# Patient Record
Sex: Male | Born: 1975 | Race: White | Hispanic: No | Marital: Single
Health system: Southern US, Community
[De-identification: ages and names within clinical notes are randomized; demographics above are authoritative.]

---

## 1999-09-28 ENCOUNTER — Emergency Department (HOSPITAL_COMMUNITY): Admission: EM | Admit: 1999-09-28 | Discharge: 1999-09-28 | Payer: Self-pay | Admitting: Emergency Medicine

## 1999-09-28 ENCOUNTER — Encounter: Payer: Self-pay | Admitting: Emergency Medicine

## 2015-11-30 ENCOUNTER — Other Ambulatory Visit: Payer: Self-pay | Admitting: Family

## 2015-11-30 DIAGNOSIS — R945 Abnormal results of liver function studies: Secondary | ICD-10-CM

## 2015-12-14 ENCOUNTER — Ambulatory Visit
Admission: RE | Admit: 2015-12-14 | Discharge: 2015-12-14 | Disposition: A | Discharge status: Home / Self Care | Payer: Commercial Managed Care - HMO | Source: Ambulatory Visit | Attending: Family | Admitting: Family

## 2015-12-14 DIAGNOSIS — R945 Abnormal results of liver function studies: Secondary | ICD-10-CM

## 2017-07-30 IMAGING — US US ABDOMEN LIMITED
1 series · 14 of 25 positions shown · non-contrast
Comparison: None.

CLINICAL DATA: Abnormal liver function test.

EXAM:
US ABDOMEN LIMITED - RIGHT UPPER QUADRANT

[Series 1: us abdomen limited · 0.24mm/px · 14 of 39 slices shown]
[im 1/39]
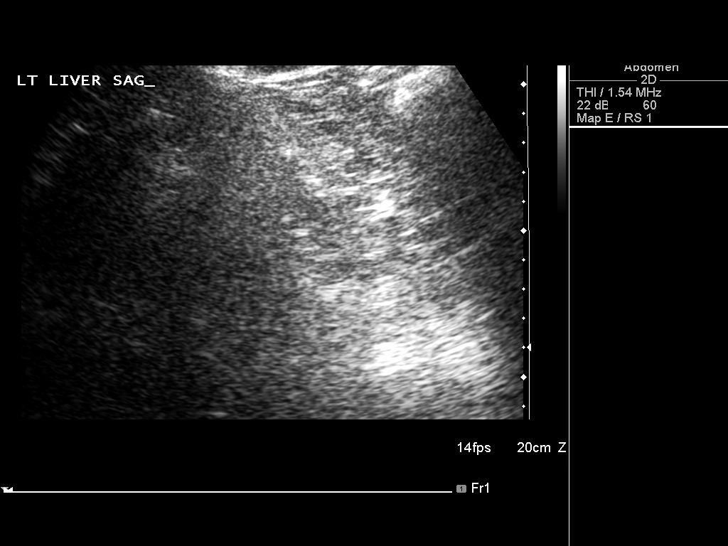
[im 4/39]
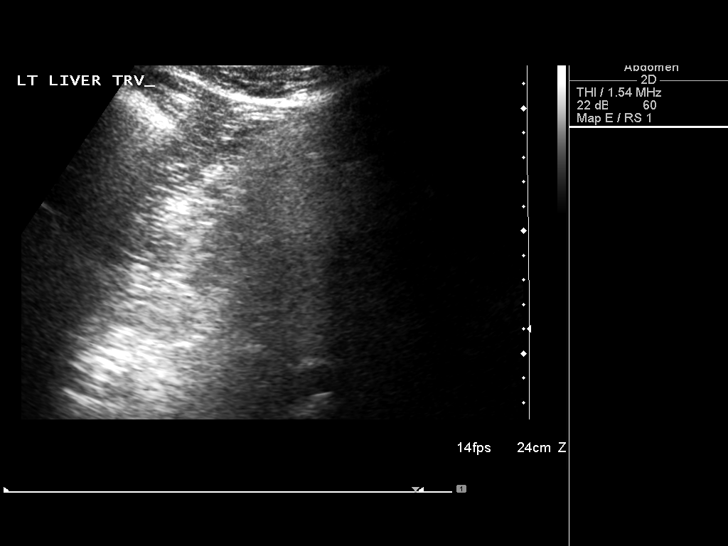
[im 7/39]
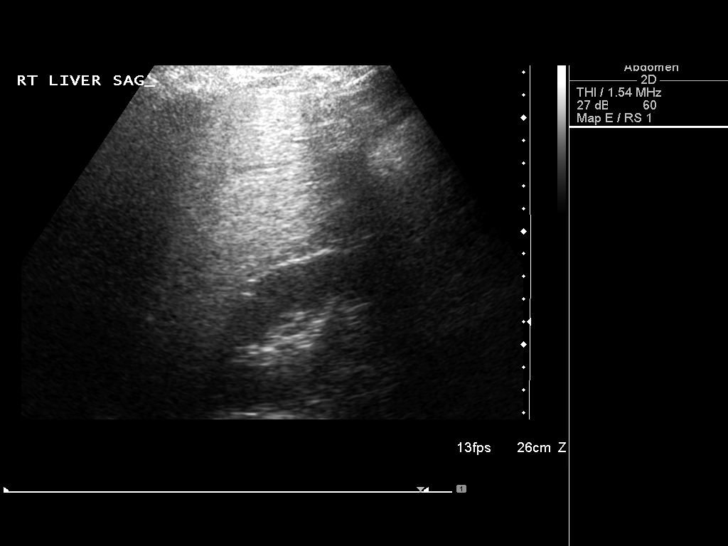
[im 10/39]
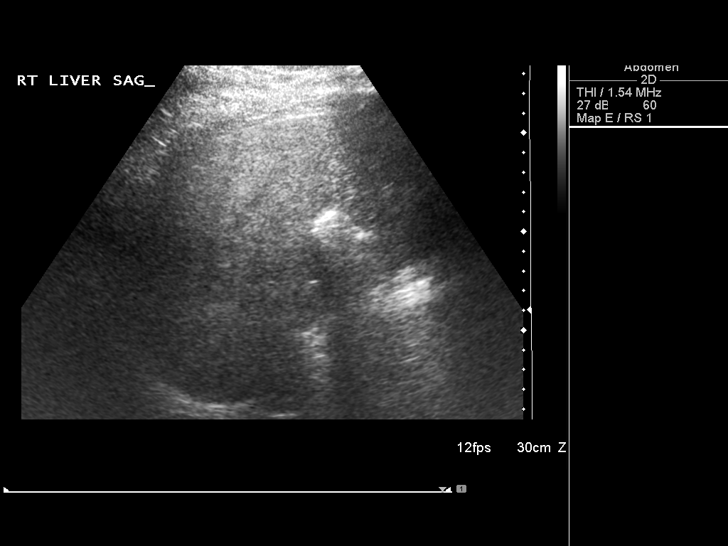
[im 13/39]
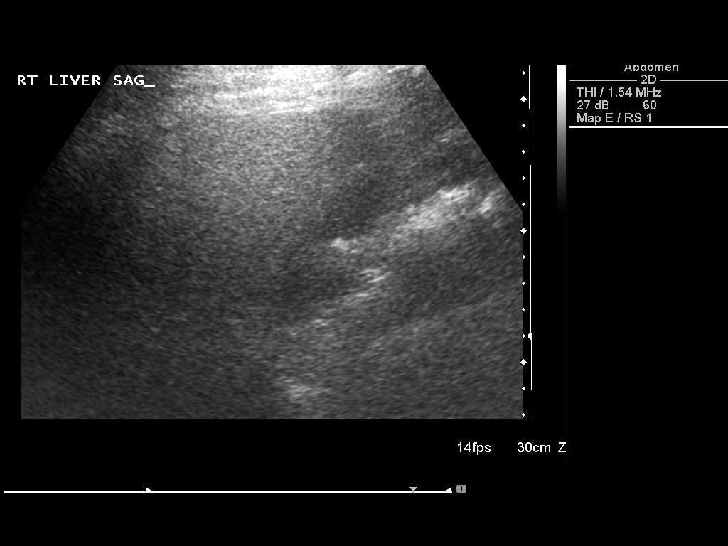
[im 15/39]
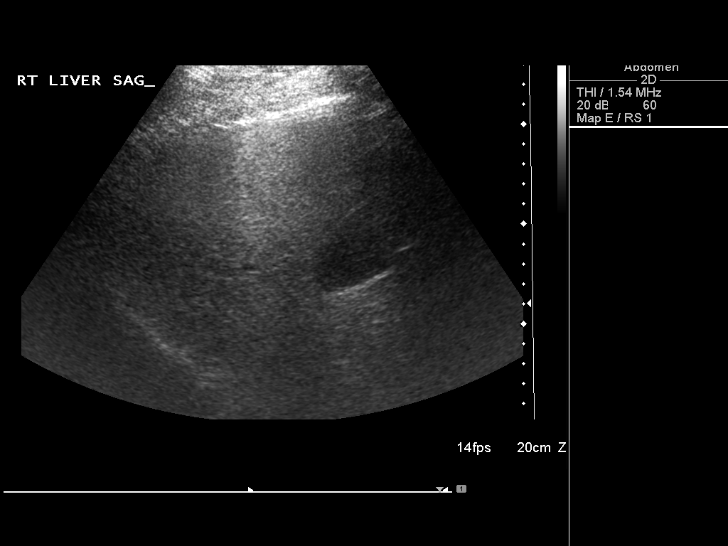
[im 18/39]
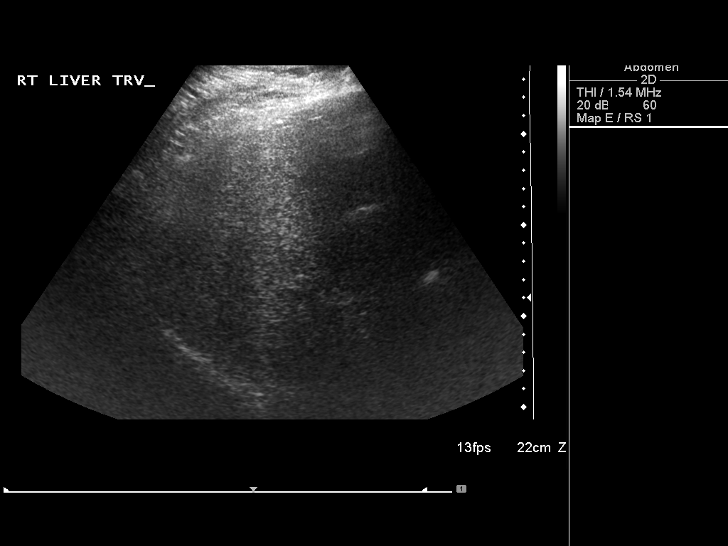
[im 21/39]
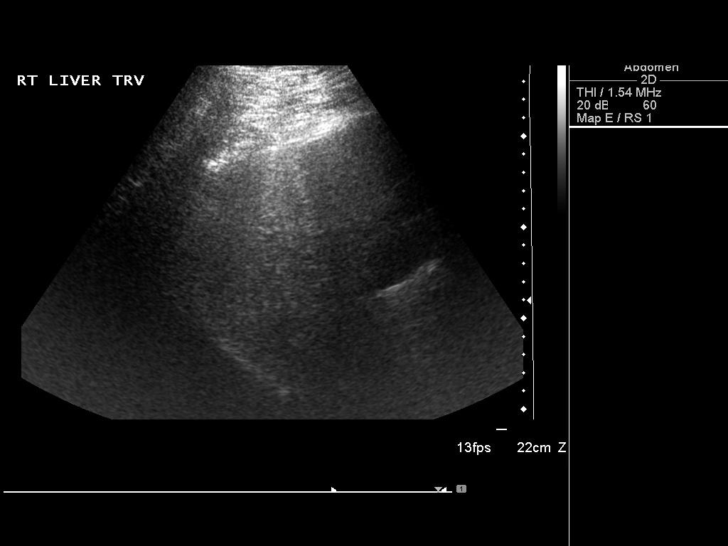
[im 24/39]
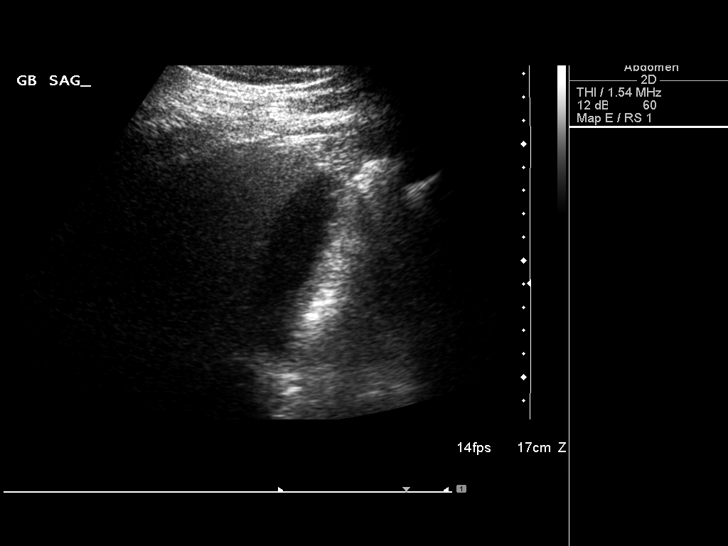
[im 26/39]
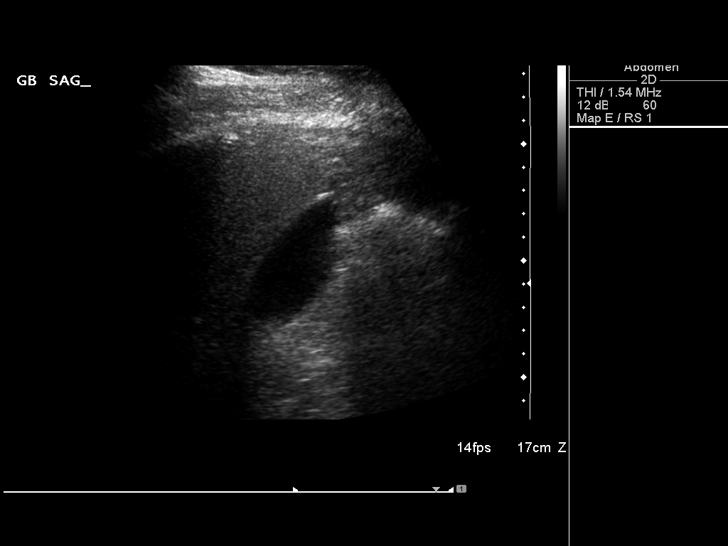
[im 29/39]
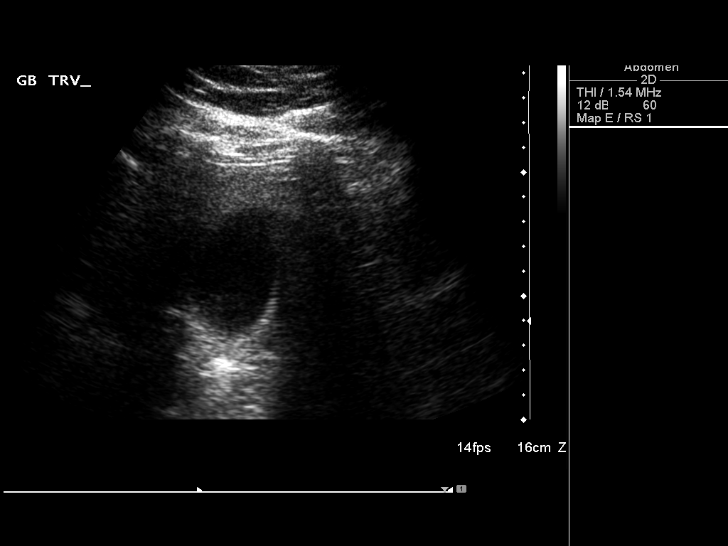
[im 32/39]
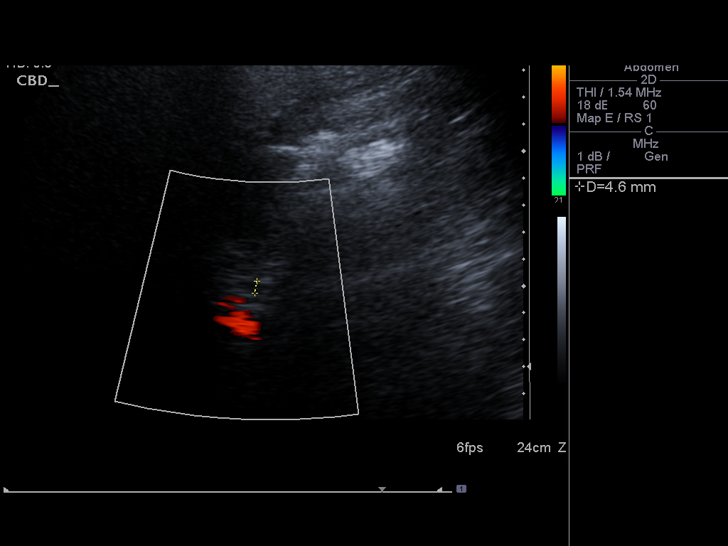
[im 35/39]
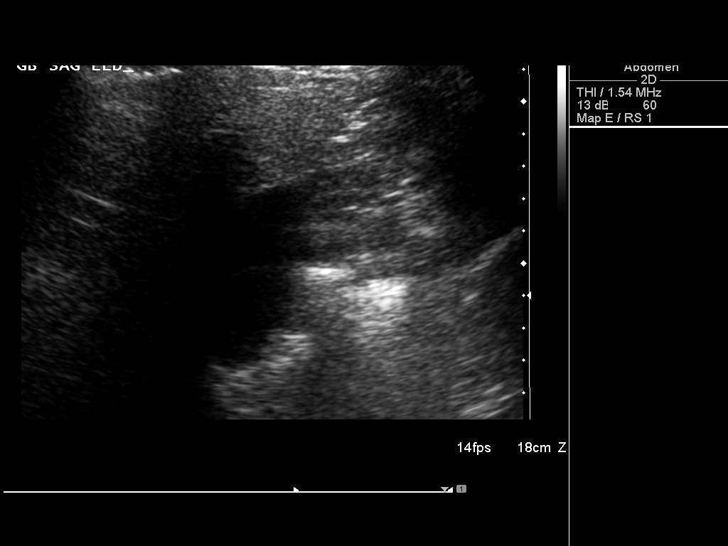
[im 39/39]
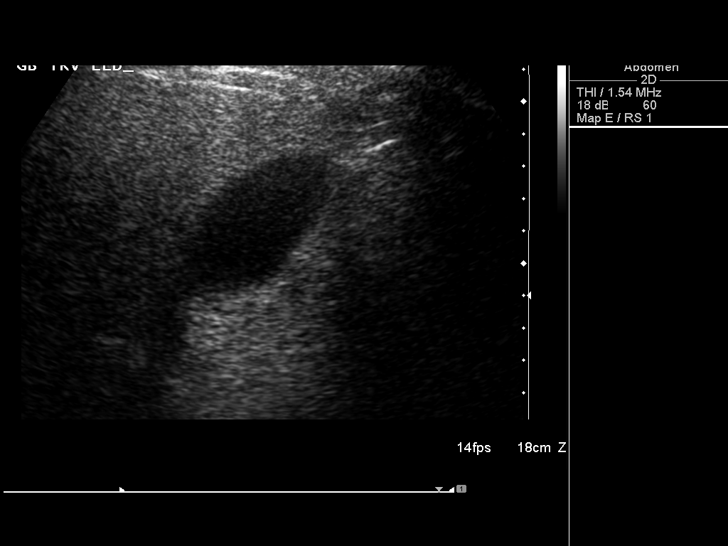

[14 of 25 positions shown; findings below may reference images not displayed]

FINDINGS: Gallbladder:

No gallstones or wall thickening visualized. No sonographic Murphy
sign noted by sonographer.

Common bile duct:

Diameter: 4.6 mm

Liver:

No focal lesion identified. Increased hepatic parenchymal
echogenicity.
IMPRESSION: 1. Increased hepatic echogenicity as can be seen with hepatic
steatosis.

## 2019-11-04 ENCOUNTER — Ambulatory Visit: Payer: 59 | Attending: Internal Medicine

## 2019-11-04 DIAGNOSIS — Z23 Encounter for immunization: Secondary | ICD-10-CM | POA: Insufficient documentation

## 2019-11-04 NOTE — Progress Notes (Signed)
   Covid-19 Vaccination Clinic  Name:  Timothy Lara    MRN: [id number] DOB: [birth year]  11/04/2019  Mr. Timothy Lara was observed post Covid-19 immunization for 15 minutes without incidence. He was provided with Vaccine Information Sheet and instruction to access the V-Safe system.   Mr. Timothy Lara was instructed to call 911 with any severe reactions post vaccine: Marland Kitchen Difficulty breathing  . Swelling of your face and throat  . A fast heartbeat  . A bad rash all over your body  . Dizziness and weakness    Immunizations Administered    Name Date Dose VIS Date Route   Pfizer COVID-19 Vaccine 11/04/2019 12:40 PM 0.3 mL 08/26/2019 Intramuscular   Manufacturer: [employer], [employer]   Lot: [id number]   NDC: [address]-9

## 2019-11-29 ENCOUNTER — Ambulatory Visit: Payer: 59 | Attending: Internal Medicine

## 2019-11-29 DIAGNOSIS — Z23 Encounter for immunization: Secondary | ICD-10-CM

## 2019-11-29 NOTE — Progress Notes (Signed)
   Covid-19 Vaccination Clinic  Name:  Timothy Lara    MRN: [id number] DOB: 09-30-1975  11/29/2019  Mr. [name] was observed post Covid-19 immunization for 15 minutes without incident. He was provided with Vaccine Information Sheet and instruction to access the V-Safe system.   Mr. [name] was instructed to call 911 with any severe reactions post vaccine: Marland Kitchen Difficulty breathing  . Swelling of face and throat  . A fast heartbeat  . A bad rash all over body  . Dizziness and weakness   Immunizations Administered    Name Date Dose VIS Date Route   Pfizer COVID-19 Vaccine 11/29/2019  9:00 AM 0.3 mL 08/26/2019 Intramuscular   Manufacturer: [employer], [employer]   Lot: [id number]   NDC: [address]-7

## 2022-10-16 ENCOUNTER — Ambulatory Visit: Payer: 59 | Admitting: Podiatry
